# Patient Record
Sex: Female | Born: 1960 | Race: White | Hispanic: No | Marital: Married
Health system: Southern US, Community
[De-identification: ages and names within clinical notes are randomized; demographics above are authoritative.]

---

## 1999-06-08 ENCOUNTER — Other Ambulatory Visit: Admission: RE | Admit: 1999-06-08 | Discharge: 1999-06-08 | Payer: Self-pay | Admitting: Family Medicine

## 2002-07-17 ENCOUNTER — Other Ambulatory Visit: Admission: RE | Admit: 2002-07-17 | Discharge: 2002-07-17 | Payer: Self-pay | Admitting: Internal Medicine

## 2004-02-06 ENCOUNTER — Ambulatory Visit: Payer: Self-pay | Admitting: Internal Medicine

## 2004-02-18 ENCOUNTER — Ambulatory Visit: Payer: Self-pay | Admitting: Family Medicine

## 2004-03-03 ENCOUNTER — Other Ambulatory Visit: Admission: RE | Admit: 2004-03-03 | Discharge: 2004-03-03 | Payer: Self-pay | Admitting: Internal Medicine

## 2004-03-03 ENCOUNTER — Ambulatory Visit: Payer: Self-pay | Admitting: Internal Medicine

## 2004-05-04 ENCOUNTER — Ambulatory Visit: Payer: Self-pay | Admitting: Family Medicine

## 2005-04-07 ENCOUNTER — Ambulatory Visit: Payer: Self-pay | Admitting: Family Medicine

## 2005-09-30 ENCOUNTER — Ambulatory Visit: Payer: Self-pay | Admitting: General Surgery

## 2006-05-02 ENCOUNTER — Ambulatory Visit: Payer: Self-pay

## 2007-07-05 ENCOUNTER — Ambulatory Visit: Payer: Self-pay

## 2008-09-17 ENCOUNTER — Ambulatory Visit: Payer: Self-pay

## 2009-11-09 ENCOUNTER — Ambulatory Visit: Payer: Self-pay

## 2011-10-12 ENCOUNTER — Ambulatory Visit: Payer: Self-pay

## 2012-10-15 ENCOUNTER — Ambulatory Visit: Payer: Self-pay

## 2012-11-26 ENCOUNTER — Ambulatory Visit: Payer: Self-pay | Admitting: Physician Assistant

## 2013-06-19 DIAGNOSIS — I10 Essential (primary) hypertension: Secondary | ICD-10-CM | POA: Insufficient documentation

## 2013-06-19 DIAGNOSIS — B351 Tinea unguium: Secondary | ICD-10-CM | POA: Insufficient documentation

## 2013-06-19 DIAGNOSIS — E039 Hypothyroidism, unspecified: Secondary | ICD-10-CM | POA: Insufficient documentation

## 2013-06-19 DIAGNOSIS — E782 Mixed hyperlipidemia: Secondary | ICD-10-CM | POA: Insufficient documentation

## 2013-11-08 ENCOUNTER — Ambulatory Visit: Payer: Self-pay

## 2013-11-14 DIAGNOSIS — I82409 Acute embolism and thrombosis of unspecified deep veins of unspecified lower extremity: Secondary | ICD-10-CM | POA: Insufficient documentation

## 2013-11-14 DIAGNOSIS — R739 Hyperglycemia, unspecified: Secondary | ICD-10-CM | POA: Insufficient documentation

## 2013-11-14 DIAGNOSIS — F32A Depression, unspecified: Secondary | ICD-10-CM | POA: Insufficient documentation

## 2014-02-24 ENCOUNTER — Ambulatory Visit: Payer: Self-pay | Admitting: Unknown Physician Specialty

## 2014-07-28 LAB — SURGICAL PATHOLOGY

## 2015-03-25 ENCOUNTER — Other Ambulatory Visit: Payer: Self-pay | Admitting: Obstetrics and Gynecology

## 2015-03-25 DIAGNOSIS — Z1231 Encounter for screening mammogram for malignant neoplasm of breast: Secondary | ICD-10-CM

## 2015-04-09 ENCOUNTER — Ambulatory Visit
Admission: RE | Admit: 2015-04-09 | Discharge: 2015-04-09 | Disposition: A | Discharge status: Home / Self Care | Payer: BC Managed Care – PPO | Source: Ambulatory Visit | Attending: Obstetrics and Gynecology | Admitting: Obstetrics and Gynecology

## 2015-04-09 DIAGNOSIS — Z1231 Encounter for screening mammogram for malignant neoplasm of breast: Secondary | ICD-10-CM

## 2015-06-18 DIAGNOSIS — Z6841 Body Mass Index (BMI) 40.0 and over, adult: Secondary | ICD-10-CM | POA: Insufficient documentation

## 2016-10-17 ENCOUNTER — Ambulatory Visit: Payer: BC Managed Care – PPO | Admitting: Podiatry

## 2017-01-06 ENCOUNTER — Other Ambulatory Visit: Payer: Self-pay | Admitting: Physician Assistant

## 2017-01-06 DIAGNOSIS — Z1239 Encounter for other screening for malignant neoplasm of breast: Secondary | ICD-10-CM

## 2017-09-26 ENCOUNTER — Other Ambulatory Visit: Payer: Self-pay | Admitting: Obstetrics and Gynecology

## 2017-09-26 DIAGNOSIS — Z1231 Encounter for screening mammogram for malignant neoplasm of breast: Secondary | ICD-10-CM

## 2017-10-03 ENCOUNTER — Ambulatory Visit
Admission: RE | Admit: 2017-10-03 | Discharge: 2017-10-03 | Disposition: A | Discharge status: Home / Self Care | Payer: BC Managed Care – PPO | Source: Ambulatory Visit | Attending: Obstetrics and Gynecology | Admitting: Obstetrics and Gynecology

## 2017-10-03 DIAGNOSIS — Z1231 Encounter for screening mammogram for malignant neoplasm of breast: Secondary | ICD-10-CM | POA: Diagnosis not present

## 2019-04-01 DIAGNOSIS — C439 Malignant melanoma of skin, unspecified: Secondary | ICD-10-CM | POA: Insufficient documentation

## 2019-04-01 DIAGNOSIS — Z780 Asymptomatic menopausal state: Secondary | ICD-10-CM | POA: Insufficient documentation

## 2019-04-18 DIAGNOSIS — Z85828 Personal history of other malignant neoplasm of skin: Secondary | ICD-10-CM | POA: Insufficient documentation

## 2019-06-01 ENCOUNTER — Ambulatory Visit: Payer: BC Managed Care – PPO | Attending: Internal Medicine

## 2019-06-01 DIAGNOSIS — Z23 Encounter for immunization: Secondary | ICD-10-CM | POA: Insufficient documentation

## 2019-06-01 NOTE — Progress Notes (Signed)
   Covid-19 Vaccination Clinic  Name:  Annette Garcia    MRN: [id number] DOB: 04/10/1960  06/01/2019  Annette Garcia was observed post Covid-19 immunization for 15 minutes without incidence. She was provided with Vaccine Information Sheet and instruction to access the V-Safe system.   Annette Garcia was instructed to call 911 with any severe reactions post vaccine: Marland Kitchen Difficulty breathing  . Swelling of your face and throat  . A fast heartbeat  . A bad rash all over your body  . Dizziness and weakness    Immunizations Administered    Name Date Dose VIS Date Route   Moderna COVID-19 Vaccine 06/01/2019  2:02 PM 0.5 mL 03/05/2019 Intramuscular   Manufacturer: Moderna   Lot: [id number]   [place][id number]

## 2019-06-06 ENCOUNTER — Ambulatory Visit: Payer: BC Managed Care – PPO

## 2019-06-29 ENCOUNTER — Ambulatory Visit: Payer: BC Managed Care – PPO | Attending: Internal Medicine

## 2019-06-29 DIAGNOSIS — Z23 Encounter for immunization: Secondary | ICD-10-CM

## 2019-06-29 NOTE — Progress Notes (Signed)
   Covid-19 Vaccination Clinic  Name:  Annette Garcia    MRN: [id number] DOB: 1960/06/03  06/29/2019  [name] was observed post Covid-19 immunization for 15 minutes without incident. She was provided with Vaccine Information Sheet and instruction to access the V-Safe system.   [name] was instructed to call 911 with any severe reactions post vaccine: Marland Kitchen Difficulty breathing  . Swelling of face and throat  . A fast heartbeat  . A bad rash all over body  . Dizziness and weakness   Immunizations Administered    Name Date Dose VIS Date Route   Moderna COVID-19 Vaccine 06/29/2019 12:01 PM 0.5 mL 03/05/2019 Intramuscular   Manufacturer: Levan Hurst   LotFY:[id number]   [place]:[id number]

## 2019-07-09 ENCOUNTER — Encounter: Payer: Self-pay | Admitting: Podiatry

## 2019-07-09 ENCOUNTER — Ambulatory Visit: Payer: BC Managed Care – PPO | Admitting: Podiatry

## 2019-07-09 ENCOUNTER — Other Ambulatory Visit: Payer: Self-pay

## 2019-07-09 DIAGNOSIS — B351 Tinea unguium: Secondary | ICD-10-CM | POA: Diagnosis not present

## 2019-07-09 NOTE — Progress Notes (Signed)
Subjective:  Patient ID: Annette Garcia, female    DOB: 03/08/1961,  MRN: [id number]  Chief Complaint  Patient presents with  . Nail Problem    pt is here for bil toenail fungus of both big toenails, pt is concerned that the right big toenail is split down the middle, but states that she has no pain overall.    59 y.o. female presents with the above complaint.  Patient presents with complaints of bilateral hallux toenail fungus that has been going for about a year.  Patient states of progressive gotten worse.  There is no pain associated with it.  Patient states that she just wants to get it checked out and get more information fungus and various treatment options were discussed.  She has a very mild case of fungus.  There is nail polish present which was taken down by the patient.  Patient also has a history of trauma to the bilateral hallux toenail with a component of traumatic injury to the nail itself.  She denies seeing anyone else for this.  She does not have any pain.   Review of Systems: Negative except as noted in the HPI. Denies N/V/F/Ch.  No past medical history on file.  Current Outpatient Medications:  .  ALPRAZolam (XANAX) 0.5 MG tablet, Take by mouth., Disp: , Rfl:  .  benzonatate (TESSALON) 200 MG capsule, TAKE 1 CAPSULE 3 TIMES DAILY AS NEEDED FOR COUGH, Disp: , Rfl:  .  buPROPion (WELLBUTRIN SR) 150 MG 12 hr tablet, Take 150 mg by mouth 2 (two) times daily., Disp: , Rfl:  .  buPROPion (WELLBUTRIN XL) 300 MG 24 hr tablet, Take 300 mg by mouth daily., Disp: , Rfl:  .  cephALEXin (KEFLEX) 500 MG capsule, Take 500 mg by mouth 2 (two) times daily., Disp: , Rfl:  .  levothyroxine (SYNTHROID) 150 MCG tablet, Take 150 mcg by mouth every morning., Disp: , Rfl:  .  spironolactone-hydrochlorothiazide (ALDACTAZIDE) 25-25 MG tablet, Take 1 tablet by mouth daily., Disp: , Rfl:  .  traMADol (ULTRAM) 50 MG tablet, Take 50 mg by mouth every 6 (six) hours as needed., Disp: , Rfl:  .   valACYclovir (VALTREX) 500 MG tablet, Take by mouth., Disp: , Rfl:   Social History   Tobacco Use  Smoking Status Not on file    No Known Allergies Objective:  There were no vitals filed for this visit. There is no height or weight on file to calculate BMI. Constitutional Well developed. Well nourished.  Vascular Dorsalis pedis pulses palpable bilaterally. Posterior tibial pulses palpable bilaterally. Capillary refill normal to all digits.  No cyanosis or clubbing noted. Pedal hair growth normal.  Neurologic Normal speech. Oriented to person, place, and time. Epicritic sensation to light touch grossly present bilaterally.  Dermatologic  mildly thickened elongated dystrophic/mycotic bilateral hallux nail noted.  No pain on palpation.  No other nail pathology is noted  Orthopedic: Normal joint ROM without pain or crepitus bilaterally. No visible deformities. No bony tenderness.   Radiographs: None Assessment:   1. Nail fungus   2. Onychomycosis due to dermatophyte    Plan:  Patient was evaluated and treated and all questions answered.  Bilateral hallux onychomycosis -Educated the patient on the etiology of onychomycosis and various treatment options associated with improving the fungal load.  I explained to the patient that there is 3 treatment options available to treat the onychomycosis including topical, p.o., laser treatment.  Patient elected to hold off on any intervention at  this time.  At this time patient just wanted to be educated on onychomycosis.  Given that patient only has a very mild case of onychomycosis I feel comfortable not starting her on any acute therapy.  However if the fungus worsens over time I have encouraged patient to come back and see me.  Patient states understanding   No follow-ups on file.

## 2019-08-27 ENCOUNTER — Other Ambulatory Visit: Payer: Self-pay | Admitting: Physician Assistant

## 2019-08-27 DIAGNOSIS — Z1231 Encounter for screening mammogram for malignant neoplasm of breast: Secondary | ICD-10-CM

## 2019-09-12 ENCOUNTER — Ambulatory Visit
Admission: RE | Admit: 2019-09-12 | Discharge: 2019-09-12 | Disposition: A | Discharge status: Home / Self Care | Payer: BC Managed Care – PPO | Source: Ambulatory Visit | Attending: Physician Assistant | Admitting: Physician Assistant

## 2019-09-12 DIAGNOSIS — Z1231 Encounter for screening mammogram for malignant neoplasm of breast: Secondary | ICD-10-CM | POA: Insufficient documentation

## 2020-09-11 ENCOUNTER — Other Ambulatory Visit (HOSPITAL_BASED_OUTPATIENT_CLINIC_OR_DEPARTMENT_OTHER): Payer: Self-pay

## 2020-10-14 ENCOUNTER — Other Ambulatory Visit: Payer: Self-pay | Admitting: Physician Assistant

## 2020-10-14 DIAGNOSIS — Z1231 Encounter for screening mammogram for malignant neoplasm of breast: Secondary | ICD-10-CM

## 2021-06-08 ENCOUNTER — Other Ambulatory Visit: Payer: Self-pay | Admitting: Physician Assistant

## 2021-06-08 DIAGNOSIS — Z1231 Encounter for screening mammogram for malignant neoplasm of breast: Secondary | ICD-10-CM

## 2021-08-10 ENCOUNTER — Ambulatory Visit
Admission: RE | Admit: 2021-08-10 | Discharge: 2021-08-10 | Disposition: A | Discharge status: Home / Self Care | Payer: BC Managed Care – PPO | Source: Ambulatory Visit | Attending: Physician Assistant | Admitting: Physician Assistant

## 2021-08-10 DIAGNOSIS — Z1231 Encounter for screening mammogram for malignant neoplasm of breast: Secondary | ICD-10-CM | POA: Diagnosis present

## 2021-12-11 IMAGING — MG DIGITAL SCREENING BILAT W/ TOMO W/ CAD
6 of 10 series · 6 of 30 positions shown · non-contrast
Comparison: Previous exam(s).

CLINICAL DATA: Screening.

EXAM:
DIGITAL SCREENING BILATERAL MAMMOGRAM WITH TOMO AND CAD

[L CC synth-2D]
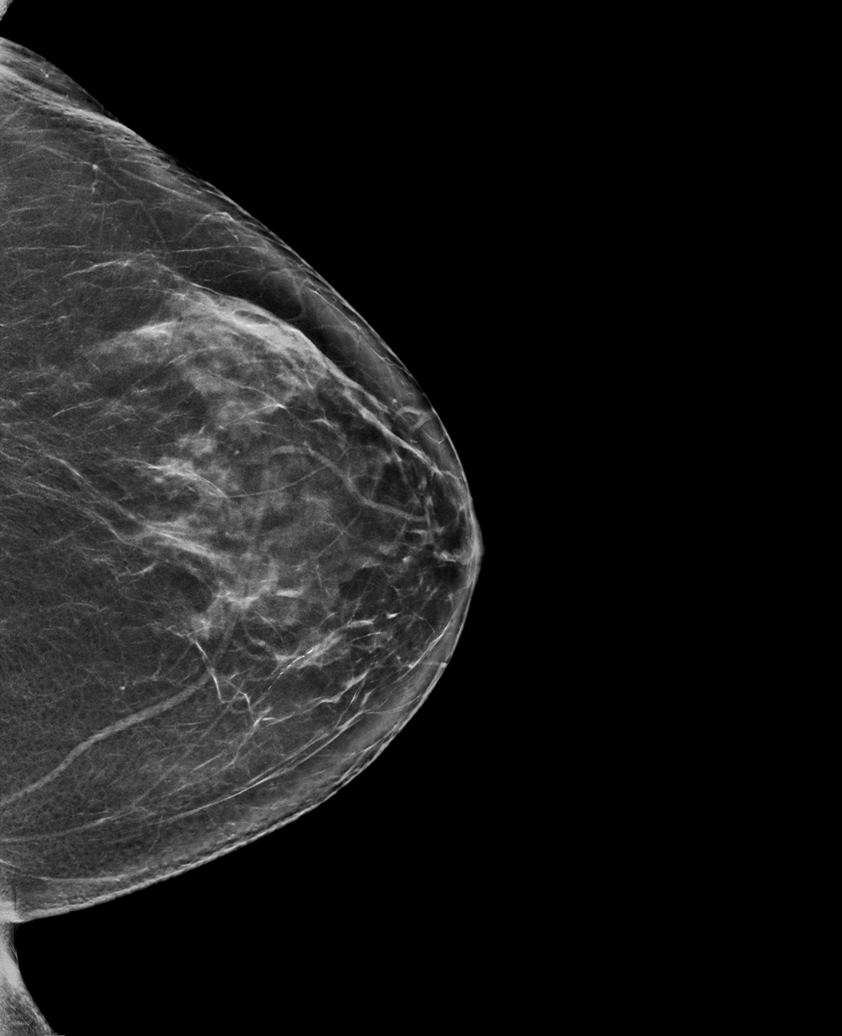

[R MLO synth-2D]
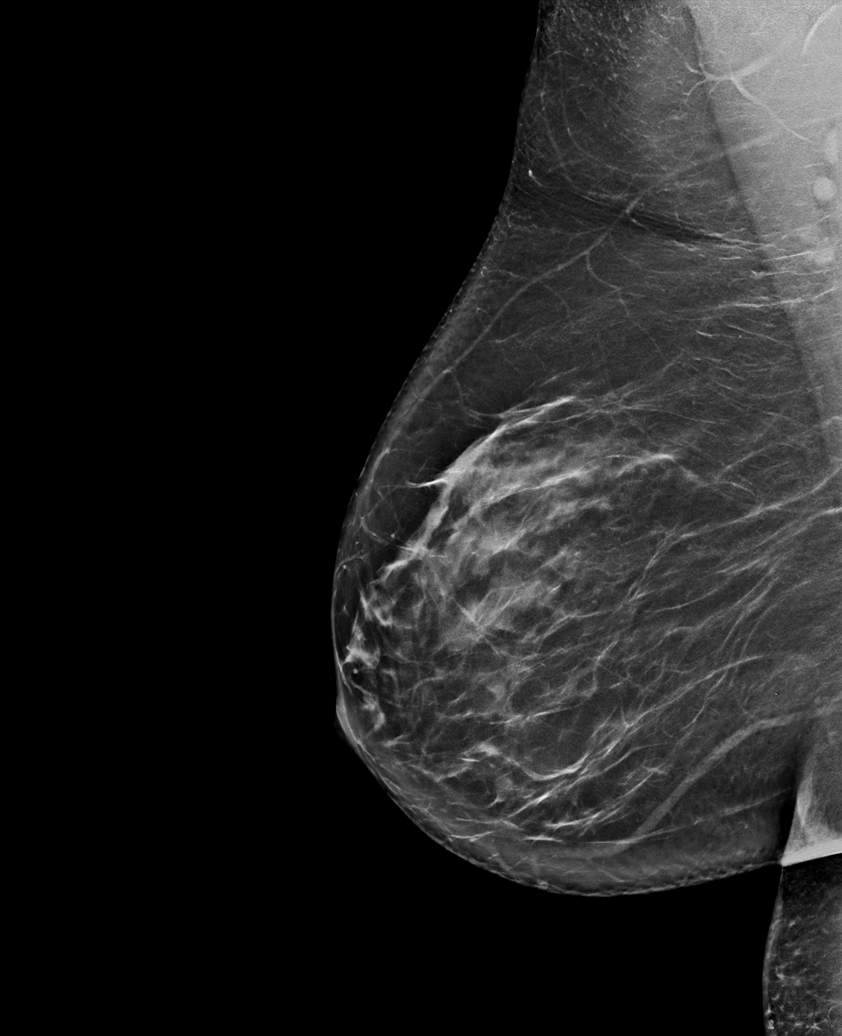

[R CC synth-2D]
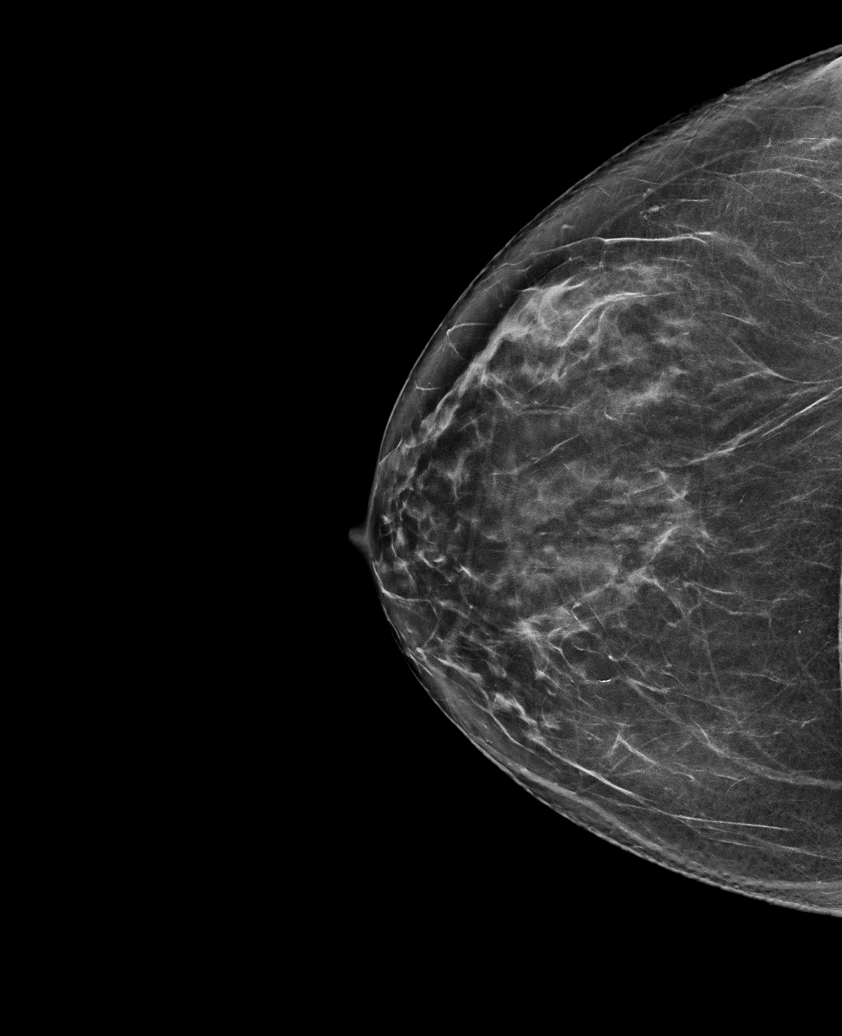

[L MLO synth-2D (1 of 2)]
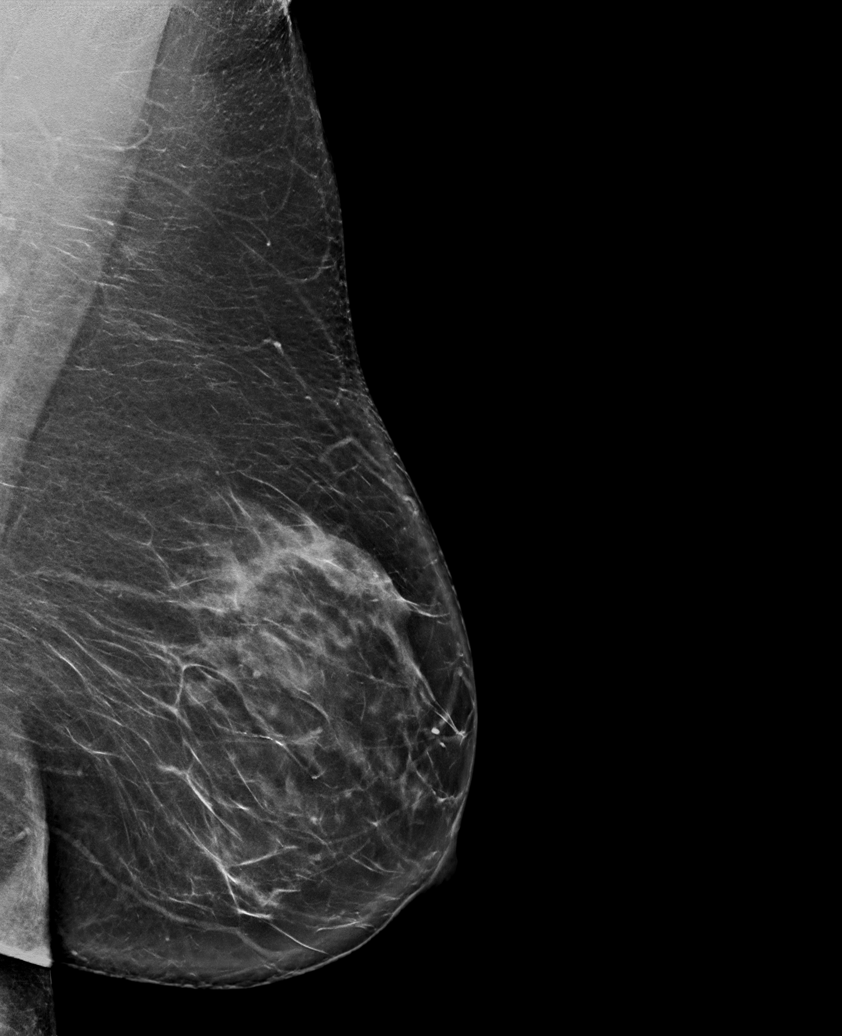

[L MLO synth-2D (2 of 2)]
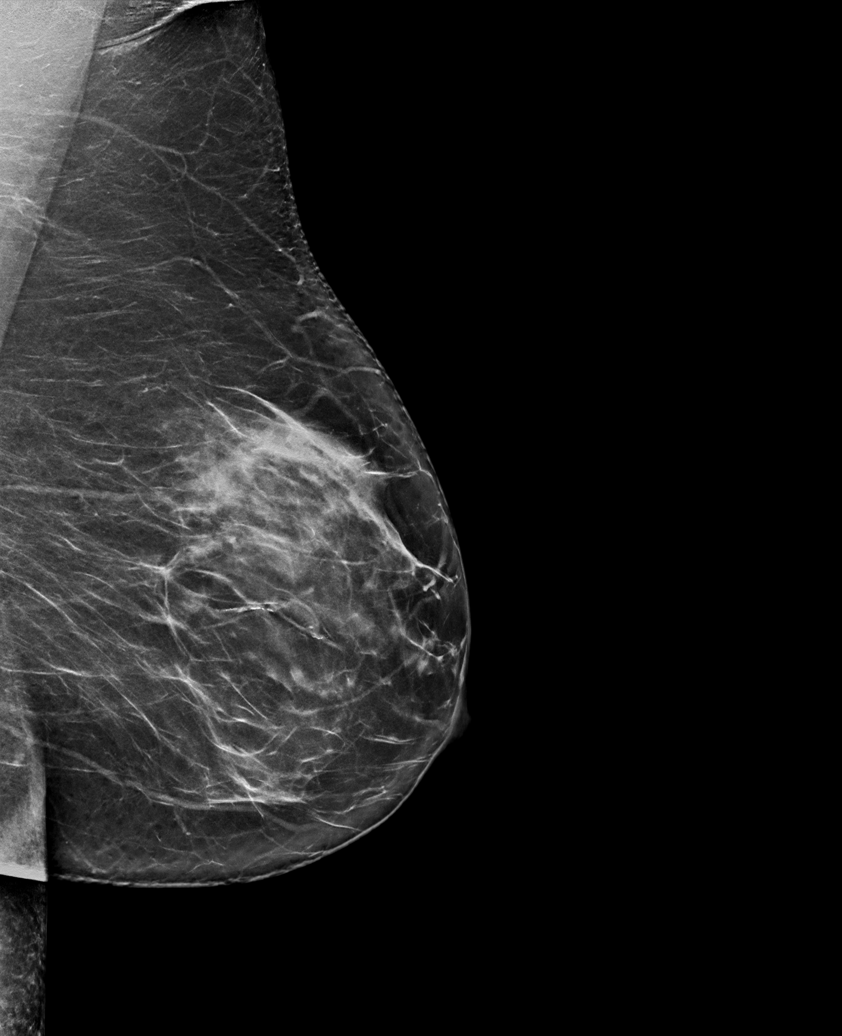

[L MLO tomo · tomo slice 44/87.0]
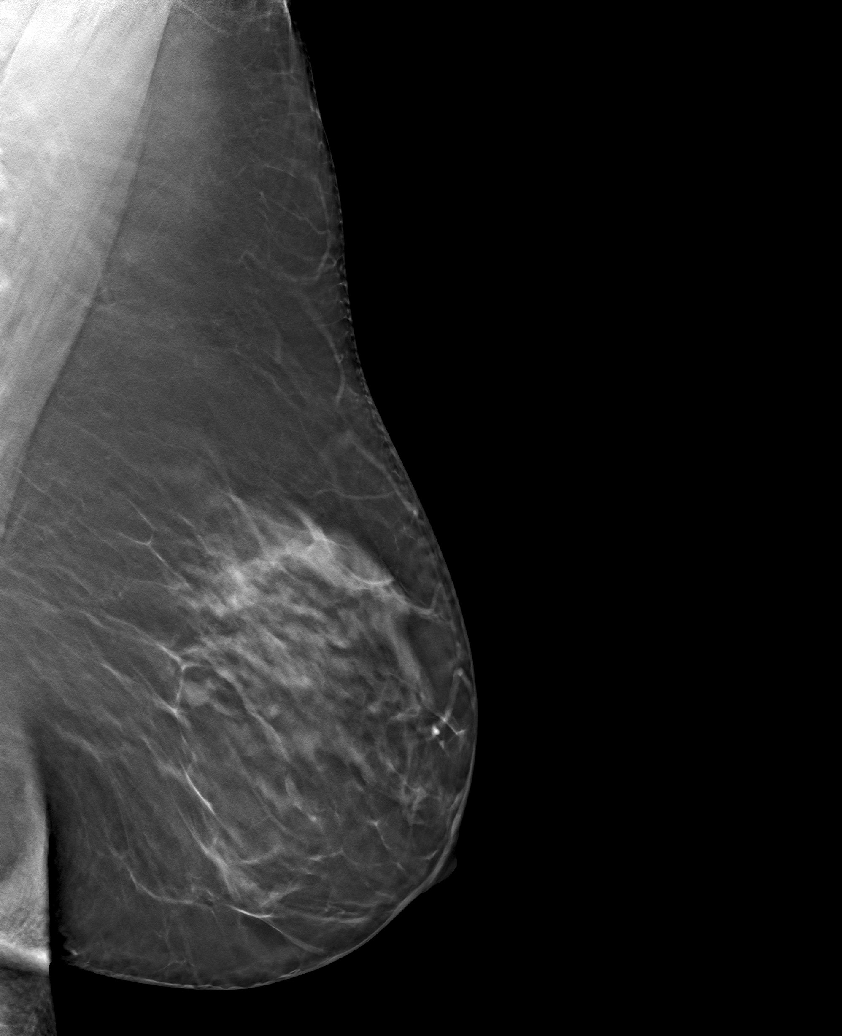

[6 of 30 positions shown; findings below may reference images not displayed]

ACR Breast Density Category c: The breast tissue is heterogeneously
dense, which may obscure small masses.
FINDINGS: There are no findings suspicious for malignancy. Images were
processed with CAD.
IMPRESSION: No mammographic evidence of malignancy. A result letter of this
screening mammogram will be mailed directly to the patient.

RECOMMENDATION:
Screening mammogram in one year. (Code:FT-U-LHB)

BI-RADS CATEGORY  1: Negative.

## 2022-06-08 ENCOUNTER — Other Ambulatory Visit: Payer: Self-pay | Admitting: Physician Assistant

## 2022-06-08 DIAGNOSIS — Z1231 Encounter for screening mammogram for malignant neoplasm of breast: Secondary | ICD-10-CM

## 2023-05-11 ENCOUNTER — Other Ambulatory Visit: Payer: Self-pay | Admitting: Physician Assistant

## 2023-05-11 DIAGNOSIS — Z1231 Encounter for screening mammogram for malignant neoplasm of breast: Secondary | ICD-10-CM

## 2023-05-16 ENCOUNTER — Ambulatory Visit
Admission: RE | Admit: 2023-05-16 | Discharge: 2023-05-16 | Disposition: A | Discharge status: Home / Self Care | Payer: 59 | Source: Ambulatory Visit | Attending: Physician Assistant | Admitting: Physician Assistant

## 2023-05-16 DIAGNOSIS — Z1231 Encounter for screening mammogram for malignant neoplasm of breast: Secondary | ICD-10-CM | POA: Insufficient documentation

## 2023-12-13 ENCOUNTER — Ambulatory Visit: Admitting: Physician Assistant

## 2023-12-13 ENCOUNTER — Encounter: Payer: Self-pay | Admitting: Physician Assistant

## 2023-12-13 DIAGNOSIS — L578 Other skin changes due to chronic exposure to nonionizing radiation: Secondary | ICD-10-CM

## 2023-12-13 DIAGNOSIS — Z8589 Personal history of malignant neoplasm of other organs and systems: Secondary | ICD-10-CM

## 2023-12-13 DIAGNOSIS — D1801 Hemangioma of skin and subcutaneous tissue: Secondary | ICD-10-CM

## 2023-12-13 DIAGNOSIS — W908XXA Exposure to other nonionizing radiation, initial encounter: Secondary | ICD-10-CM | POA: Diagnosis not present

## 2023-12-13 DIAGNOSIS — Z1283 Encounter for screening for malignant neoplasm of skin: Secondary | ICD-10-CM

## 2023-12-13 DIAGNOSIS — Z85828 Personal history of other malignant neoplasm of skin: Secondary | ICD-10-CM

## 2023-12-13 DIAGNOSIS — L821 Other seborrheic keratosis: Secondary | ICD-10-CM

## 2023-12-13 DIAGNOSIS — Z85820 Personal history of malignant melanoma of skin: Secondary | ICD-10-CM

## 2023-12-13 DIAGNOSIS — L814 Other melanin hyperpigmentation: Secondary | ICD-10-CM

## 2023-12-13 DIAGNOSIS — D229 Melanocytic nevi, unspecified: Secondary | ICD-10-CM

## 2023-12-13 NOTE — Patient Instructions (Signed)
 Skin Education : We counseled the patient regarding the following: Sun screen (SPF 30 or greater) should be applied during peak UV exposure (between 10am and 2pm) and reapplied after exercise or swimming.  The ABCDEs of melanoma were reviewed with the patient, and the importance of monthly self-examination of moles was emphasized. Should any moles change in shape or color, or itch, bleed or burn, pt will contact our office for evaluation sooner then their interval appointment.  Plan: Sunscreen Recommendations We recommended a broad spectrum sunscreen with a SPF of 30 or higher. SPF 30 sunscreens block approximately 97 percent of the sun's harmful rays. Sunscreens should be applied at least 15 minutes prior to expected sun exposure and then every 2 hours after that as long as sun exposure continues. If swimming or exercising sunscreen should be reapplied every 45 minutes to an hour after getting wet or sweating. One ounce, or the equivalent of a shot glass full of sunscreen, is adequate to protect the skin not covered by a bathing suit. We also recommended a lip balm with a sunscreen as well. Sun protective clothing can be used in lieu of sunscreen but must be worn the entire time you are exposed to the sun's rays.   Important Information   Due to recent changes in healthcare laws, you may see results of your pathology and/or laboratory studies on MyChart before the doctors have had a chance to review them. We understand that in some cases there may be results that are confusing or concerning to you. Please understand that not all results are received at the same time and often the doctors may need to interpret multiple results in order to provide you with the best plan of care or course of treatment. Therefore, we ask that you please give us  2 business days to thoroughly review all your results before contacting the office for clarification. Should we see a critical lab result, you will be contacted  sooner.     If You Need Anything After Your Visit   If you have any questions or concerns for your doctor, please call our main line at [phone]. If no one answers, please leave a voicemail as directed and we will return your call as soon as possible. Messages left after 4 pm will be answered the following business day.    You may also send us  a message via MyChart. We typically respond to MyChart messages within 1-2 business days.  For prescription refills, please ask your pharmacy to contact our office. Our fax number is [phone].  If you have an urgent issue when the clinic is closed that cannot wait until the next business day, you can page your doctor at the number below.     Please note that while we do our best to be available for urgent issues outside of office hours, we are not available 24/7.    If you have an urgent issue and are unable to reach us , you may choose to seek medical care at your doctor's office, retail clinic, urgent care center, or emergency room.   If you have a medical emergency, please immediately call 911 or go to the emergency department. In the event of inclement weather, please call our main line at [phone] for an update on the status of any delays or closures.  Dermatology Medication Tips: Please keep the boxes that topical medications come in in order to help keep track of the instructions about where and how to use these. Pharmacies typically  print the medication instructions only on the boxes and not directly on the medication tubes.   If your medication is too expensive, please contact our office at [phone] or send us  a message through MyChart.    We are unable to tell what your co-pay for medications will be in advance as this is different depending on your insurance coverage. However, we may be able to find a substitute medication at lower cost or fill out paperwork to get insurance to cover a needed medication.    If a prior  authorization is required to get your medication covered by your insurance company, please allow us  1-2 business days to complete this process.   Drug prices often vary depending on where the prescription is filled and some pharmacies may offer cheaper prices.   The website www.goodrx.com contains coupons for medications through different pharmacies. The prices here do not account for what the cost may be with help from insurance (it may be cheaper with your insurance), but the website can give you the price if you did not use any insurance.  - You can print the associated coupon and take it with your prescription to the pharmacy.  - You may also stop by our office during regular business hours and pick up a GoodRx coupon card.  - If you need your prescription sent electronically to a different pharmacy, notify our office through [facility] or by phone at [phone]

## 2023-12-13 NOTE — Progress Notes (Addendum)
 New Patient Visit   Subjective  Annette Garcia is a 63 y.o. female NEW PATIENT who presents for the following: Skin Cancer Screening and Full Body Skin Exam.   The patient presents for Total-Body Skin Exam (TBSE) for skin cancer screening and mole check. The patient has spots, moles and lesions to be evaluated, some may be new or changing and the patient may have concern these could be cancer.  Patient has a history of melanoma (Dr. Bluford - [facility]) and squamous cell carcinoma. Previously saw [facility] and lastly was seen routinely by Erminio POUR. Azaya Goedde, PA-C at [facility].    The following portions of the chart were reviewed this encounter and updated as appropriate: medications, allergies, medical history  Review of Systems:  No other skin or systemic complaints except as noted in HPI or Assessment and Plan.  Objective  Well appearing patient in no apparent distress; mood and affect are within normal limits.  A full examination was performed including scalp, head, eyes, ears, nose, lips, neck, chest, axillae, abdomen, back, buttocks, bilateral upper extremities, bilateral lower extremities, hands, feet, fingers, toes, fingernails, and toenails. All findings within normal limits unless otherwise noted below.   Relevant physical exam findings are noted in the Assessment and Plan.  No palpable cervical, axillary or inguinal lymphadenopathy.     Assessment & Plan   SKIN CANCER SCREENING PERFORMED TODAY.  ACTINIC DAMAGE - Chronic condition, secondary to cumulative UV/sun exposure - diffuse scaly erythematous macules with underlying dyspigmentation - Recommend daily broad spectrum sunscreen SPF 30+ to sun-exposed areas, reapply every 2 hours as needed.  - Staying in the shade or wearing long sleeves, sun glasses (UVA+UVB protection) and wide brim hats (4-inch brim around the entire circumference of the hat) are also recommended for sun protection.  - Call  for new or changing lesions.   MELANOCYTIC NEVI - Tan-brown and/or pink-flesh-colored symmetric macules and papules - Benign appearing on exam today - Observation - Call clinic for new or changing moles - Recommend daily use of broad spectrum spf 30+ sunscreen to sun-exposed areas.   LENTIGINES Exam: scattered tan macules Due to sun exposure Treatment Plan: Benign-appearing, observe. Recommend daily broad spectrum sunscreen SPF 30+ to sun-exposed areas, reapply every 2 hours as needed.  Call for any changes   HEMANGIOMA/ Cherry angiomas  Exam: red papule(s) Discussed benign nature. Recommend observation. Call for changes.   SEBORRHEIC KERATOSIS - Stuck-on, waxy, tan-brown papules and/or plaques  - Benign-appearing - Discussed benign etiology and prognosis. - Observe - Call for any changes   HISTORY OF SQUAMOUS CELL CARCINOMA OF THE SKIN 2023 - No evidence of recurrence today - No lymphadenopathy - Recommend regular full body skin exams - Recommend daily broad spectrum sunscreen SPF 30+ to sun-exposed areas, reapply every 2 hours as needed.  - Call if any new or changing lesions are noted between office visits  HISTORY OF MELANOMA jan 2021 - No evidence of recurrence today - No lymphadenopathy - Recommend regular full body skin exams - Recommend daily broad spectrum sunscreen SPF 30+ to sun-exposed areas, reapply every 2 hours as needed.  - Call if any new or changing lesions are noted between office visits  - Will request prior pathology reports     SCREENING EXAM FOR SKIN CANCER   PERSONAL HISTORY OF MALIGNANT MELANOMA OF SKIN   HISTORY OF NONMELANOMA SKIN CANCER   ACTINIC SKIN DAMAGE   MULTIPLE BENIGN NEVI   LENTIGINES   CHERRY ANGIOMA  SEBORRHEIC KERATOSIS   HISTORY OF SQUAMOUS CELL CARCINOMA    Return in about 6 months (around 06/11/2024) for TBSE, have pt sign medical release to get all previous pathologies.  I, Darice Smock, CMA, am  acting as scribe for [employer], PA-C.   Documentation: I have reviewed the above documentation for accuracy and completeness, and I agree with the above.  Zarina Pe K, PA-C

## 2024-02-28 ENCOUNTER — Other Ambulatory Visit: Payer: Self-pay | Admitting: Physician Assistant

## 2024-02-28 DIAGNOSIS — Z1231 Encounter for screening mammogram for malignant neoplasm of breast: Secondary | ICD-10-CM

## 2024-06-11 ENCOUNTER — Ambulatory Visit: Admitting: Physician Assistant
